# Patient Record
Sex: Male | Born: 1985 | Hispanic: No | Marital: Single | Smoking: Current some day smoker
Health system: Southern US, Community
[De-identification: ages and names within clinical notes are randomized; demographics above are authoritative.]

---

## 2007-08-14 ENCOUNTER — Emergency Department: Payer: Self-pay | Admitting: Emergency Medicine

## 2008-11-09 IMAGING — CR NASAL BONES - 3+ VIEW
1 series · 3 of 3 positions shown · non-contrast
Comparison: none

REASON FOR EXAM: injury
COMMENTS:

PROCEDURE:     DXR - DXR NASAL BONES  - August 15, 2007 [DATE]
RESULT:     Subtle distal nasal fracture cannot be entirely excluded. No
evidence of displaced nasal fracture. The paranasal sinuses are clear.

[Series 1: view not recorded · 0.17mm/px · 3 of 3 slices shown]
[im 1/3]
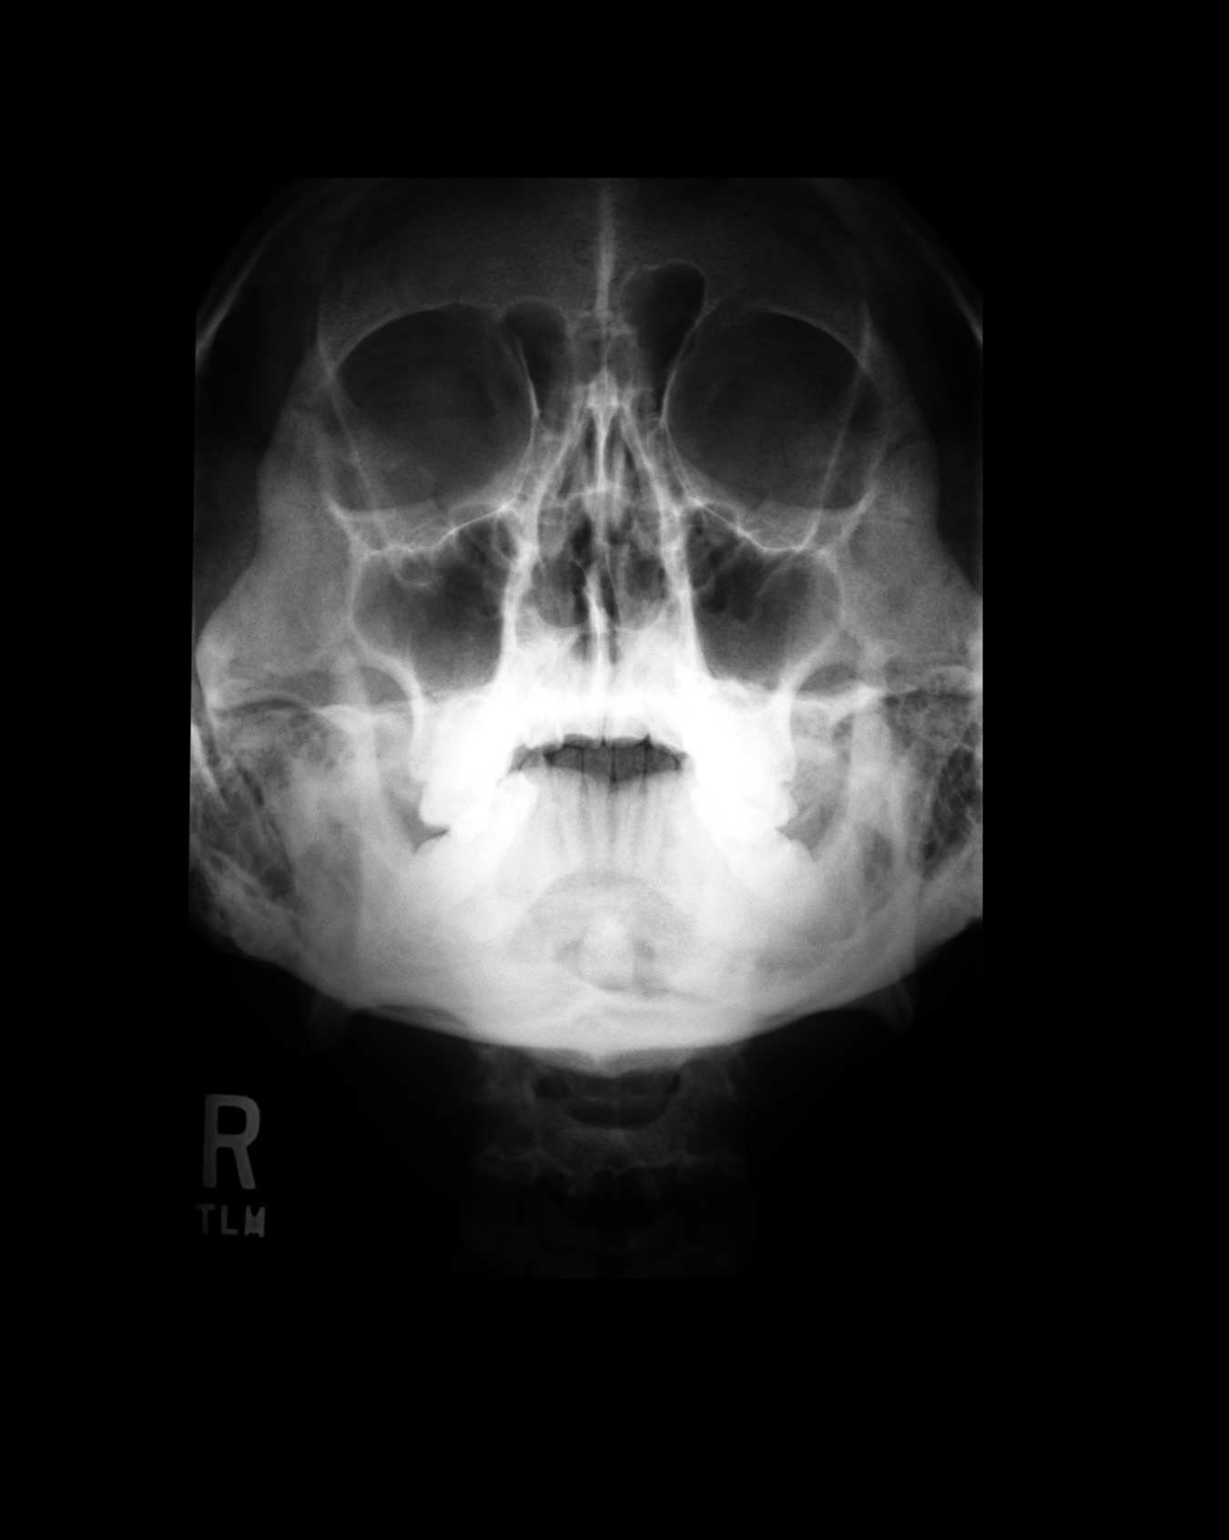
[im 2/3]
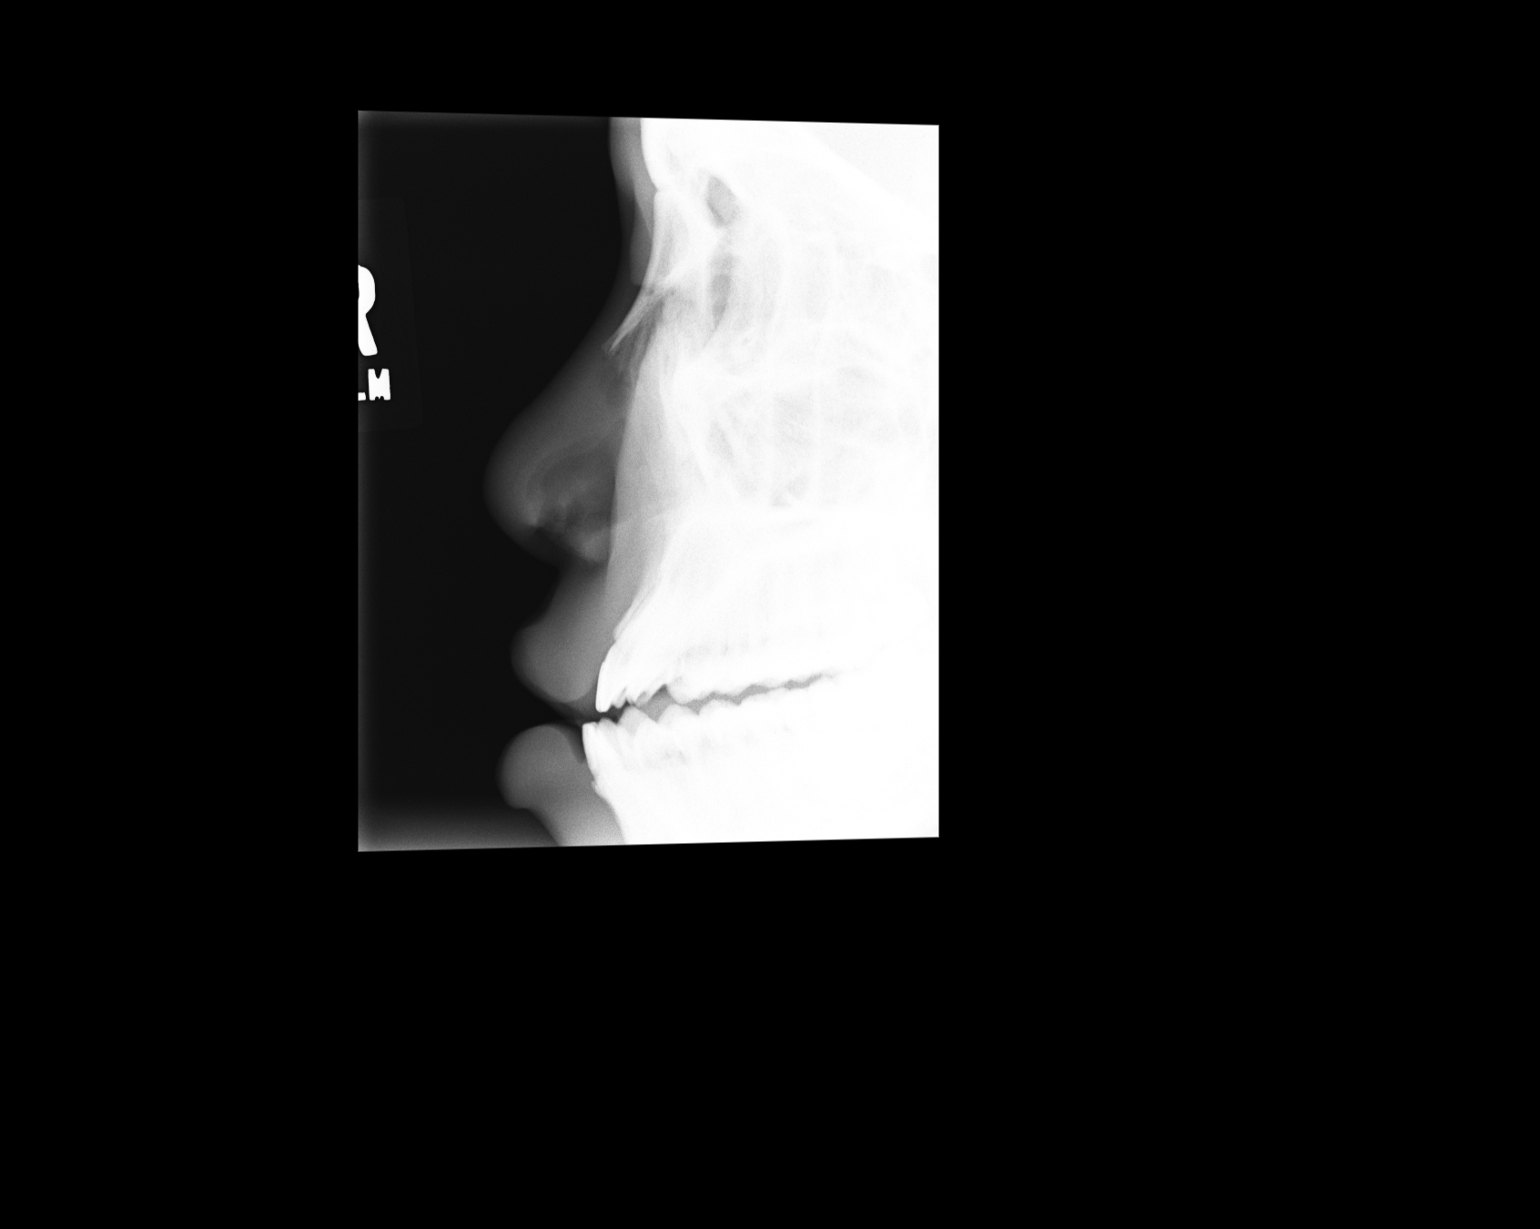
[im 3/3]
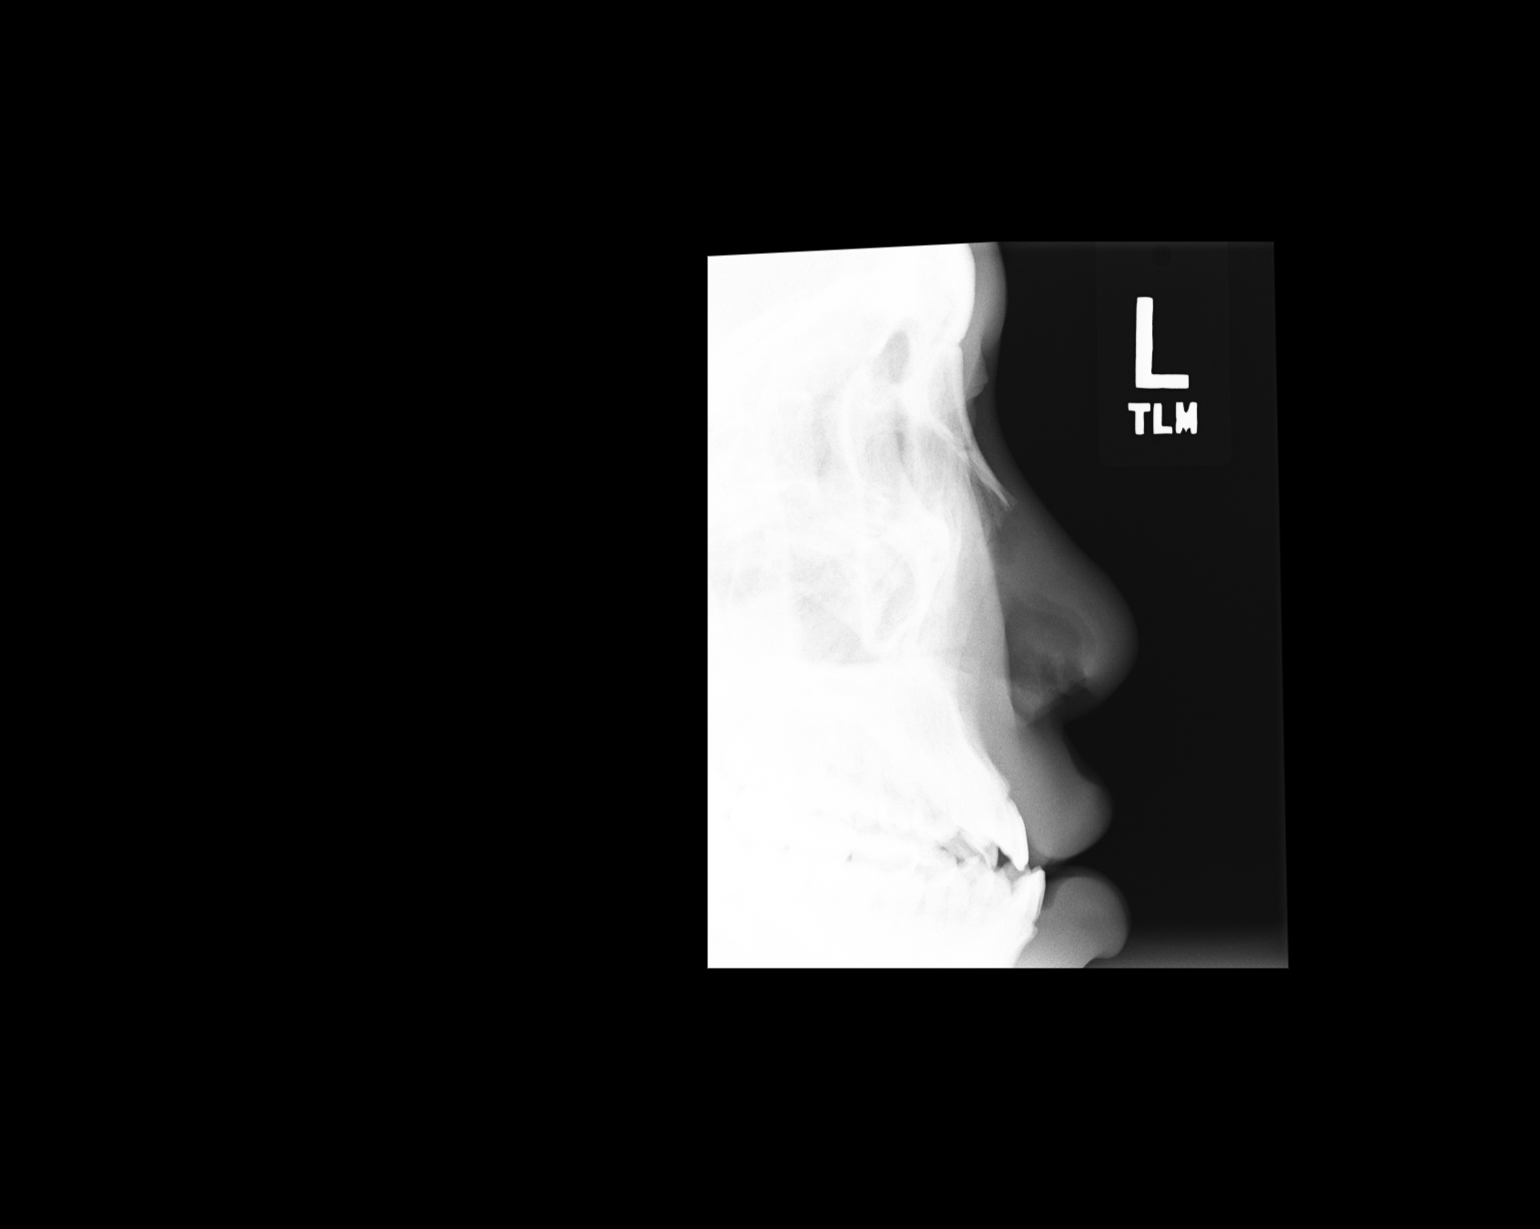

[3 of 3 positions shown; findings below may reference images not displayed]

IMPRESSION: No evidence of displaced nasal fracture. Subtle distal fracture cannot be
excluded.

## 2012-08-06 ENCOUNTER — Emergency Department: Payer: Self-pay | Admitting: Emergency Medicine

## 2012-08-06 LAB — URINALYSIS, COMPLETE
Bacteria: NONE SEEN
Bilirubin,UR: NEGATIVE
Blood: NEGATIVE
Glucose,UR: NEGATIVE mg/dL (ref 0–75)
Leukocyte Esterase: NEGATIVE
Protein: NEGATIVE
RBC,UR: <1 /HPF (ref 0–5)
Specific Gravity: 1.017 (ref 1.003–1.030)
Squamous Epithelial: <1

## 2012-08-06 LAB — TSH: Thyroid Stimulating Horm: 0.77 u[IU]/mL

## 2012-08-06 LAB — ETHANOL: Ethanol %: <0.003 % (ref 0.000–0.080)

## 2012-08-06 LAB — COMPREHENSIVE METABOLIC PANEL
Alkaline Phosphatase: 72 U/L (ref 50–136)
Anion Gap: 7 (ref 7–16)
Bilirubin,Total: 0.6 mg/dL (ref 0.2–1.0)
Calcium, Total: 8.9 mg/dL (ref 8.5–10.1)
Chloride: 106 mmol/L (ref 98–107)
Co2: 26 mmol/L (ref 21–32)
Creatinine: 1.01 mg/dL (ref 0.60–1.30)
EGFR (African American): >60
EGFR (Non-African Amer.): >60
Glucose: 93 mg/dL (ref 65–99)
Potassium: 4 mmol/L (ref 3.5–5.1)
SGOT(AST): 21 U/L (ref 15–37)
Sodium: 139 mmol/L (ref 136–145)
Total Protein: 8.4 g/dL — ABNORMAL HIGH (ref 6.4–8.2)

## 2012-08-06 LAB — CBC
HGB: 12.7 g/dL — ABNORMAL LOW (ref 13.0–18.0)
MCH: 25.1 pg — ABNORMAL LOW (ref 26.0–34.0)
MCHC: 32.2 g/dL (ref 32.0–36.0)
MCV: 78 fL — ABNORMAL LOW (ref 80–100)
Platelet: 309 10*3/uL (ref 150–440)
RBC: 5.05 10*6/uL (ref 4.40–5.90)
WBC: 8.2 10*3/uL (ref 3.8–10.6)

## 2012-08-06 LAB — DRUG SCREEN, URINE
Amphetamines, Ur Screen: NEGATIVE (ref ?–1000)
Benzodiazepine, Ur Scrn: NEGATIVE (ref ?–200)
Opiate, Ur Screen: POSITIVE (ref ?–300)
Phencyclidine (PCP) Ur S: NEGATIVE (ref ?–25)

## 2016-12-14 ENCOUNTER — Encounter: Payer: Self-pay | Admitting: Emergency Medicine

## 2016-12-14 ENCOUNTER — Emergency Department
Admission: EM | Admit: 2016-12-14 | Discharge: 2016-12-14 | Disposition: A | Discharge status: Home / Self Care | Payer: Self-pay | Attending: Emergency Medicine | Admitting: Emergency Medicine

## 2016-12-14 DIAGNOSIS — F17210 Nicotine dependence, cigarettes, uncomplicated: Secondary | ICD-10-CM | POA: Insufficient documentation

## 2016-12-14 DIAGNOSIS — R55 Syncope and collapse: Secondary | ICD-10-CM | POA: Insufficient documentation

## 2016-12-14 LAB — BASIC METABOLIC PANEL
Anion gap: 6 (ref 5–15)
BUN: 13 mg/dL (ref 6–20)
CALCIUM: 9.1 mg/dL (ref 8.9–10.3)
CHLORIDE: 103 mmol/L (ref 101–111)
CO2: 29 mmol/L (ref 22–32)
CREATININE: 0.83 mg/dL (ref 0.61–1.24)
GFR calc Af Amer: >60 mL/min (ref >60)
GFR calc non Af Amer: >60 mL/min (ref >60)
GLUCOSE: 106 mg/dL — AB (ref 65–99)
Potassium: 4.2 mmol/L (ref 3.5–5.1)
Sodium: 138 mmol/L (ref 135–145)

## 2016-12-14 LAB — URINALYSIS, COMPLETE (UACMP) WITH MICROSCOPIC
Bacteria, UA: NONE SEEN
Bilirubin Urine: NEGATIVE
Glucose, UA: NEGATIVE mg/dL
Hgb urine dipstick: NEGATIVE
Ketones, ur: NEGATIVE mg/dL
Leukocytes, UA: NEGATIVE
Nitrite: NEGATIVE
PH: 8 (ref 5.0–8.0)
Protein, ur: NEGATIVE mg/dL
SPECIFIC GRAVITY, URINE: 1.018 (ref 1.005–1.030)
SQUAMOUS EPITHELIAL / LPF: NONE SEEN

## 2016-12-14 LAB — CBC
HCT: 38.3 % — ABNORMAL LOW (ref 40.0–52.0)
Hemoglobin: 12.6 g/dL — ABNORMAL LOW (ref 13.0–18.0)
MCH: 25.8 pg — AB (ref 26.0–34.0)
MCHC: 32.9 g/dL (ref 32.0–36.0)
MCV: 78.3 fL — AB (ref 80.0–100.0)
PLATELETS: 277 10*3/uL (ref 150–440)
RBC: 4.9 MIL/uL (ref 4.40–5.90)
RDW: 14.3 % (ref 11.5–14.5)
WBC: 7 10*3/uL (ref 3.8–10.6)

## 2016-12-14 LAB — TROPONIN I

## 2016-12-14 LAB — GLUCOSE, CAPILLARY: Glucose-Capillary: 110 mg/dL — ABNORMAL HIGH (ref 65–99)

## 2016-12-14 NOTE — ED Triage Notes (Signed)
Pt to ed via POV, pt states that he had a syncopal episode 2 days ago after bending down for an extended period of time. Pt states that since then he has been feeling more tired than normal. Pt does not appear to be in any distress at this time.

## 2016-12-14 NOTE — ED Provider Notes (Signed)
[facility] Emergency Department Provider Note   ____________________________________________    I have reviewed the triage vital signs and the nursing notes.   HISTORY  Chief Complaint Loss of Consciousness     HPI Lucas Nichols is a 31 y.o. male who presents for evaluation because he reports a syncopal episode 2 days ago. Patient reports 2 days ago he was thoroughly cleaning the floor by bending over and then when he straightened up he felt lightheaded and had to sit down and thinks he may have blacked out for a few seconds. He denies any chest pain or palpitations. No recent travel or calf pain. No nausea or vomiting. He does use marijuana and occasionally uses Adderall but had not been that day. History of of opioid addiction. Currently feels quite well and has no complaints.   History reviewed. No pertinent past medical history.  There are no active problems to display for this patient.   History reviewed. No pertinent surgical history.  Prior to Admission medications   Not on File     Allergies Patient has no known allergies.  No family history on file.  Social History Social History  Substance Use Topics  . Smoking status: Current Some Day Smoker    Types: E-cigarettes  . Smokeless tobacco: Never Used  . Alcohol use No    Review of Systems  Constitutional: No fever/chills Eyes: No visual changes.  ENT: No sore throat. Cardiovascular: Denies chest pain. Respiratory: Denies shortness of breath. Gastrointestinal: No abdominal pain.  No nausea, no vomiting.   Genitourinary: Negative for dysuria. Musculoskeletal: Negative for back pain. Skin: Negative for rash. Neurological: Negative for headaches or weakness   ____________________________________________   PHYSICAL EXAM:  VITAL SIGNS: ED Triage Vitals [12/14/16 1525]  Enc Vitals Group     BP 130/78     Pulse Rate 86     Resp 18     Temp 98.4 F (36.9 C)   Temp Source Oral     SpO2 100 %     Weight 145 lb (65.8 kg)     Height 5\' 7"  (1.702 m)     Head Circumference      Peak Flow      Pain Score      Pain Loc      Pain Edu?      Excl. in GC?     Constitutional: Alert and oriented. No acute distress. Pleasant and interactive. Patient is anxious Eyes: Conjunctivae are normal.  Head: Atraumatic. Nose: No congestion/rhinnorhea. Mouth/Throat: Mucous membranes are moist.   Neck:  Painless ROM Cardiovascular: Normal rate, regular rhythm. Grossly normal heart sounds.  Good peripheral circulation. Respiratory: Normal respiratory effort.  No retractions. Lungs CTAB. Gastrointestinal: Soft and nontender. No distention.  No CVA tenderness. Genitourinary: deferred Musculoskeletal: No lower extremity tenderness nor edema.  Warm and well perfused Neurologic:  Normal speech and language. No gross focal neurologic deficits are appreciated.  Skin:  Skin is warm, dry and intact. No rash noted. Psychiatric: Mood and affect are normal. Speech and behavior are normal.  ____________________________________________   LABS (all labs ordered are listed, but only abnormal results are displayed)  Labs Reviewed  BASIC METABOLIC PANEL - Abnormal; Notable for the following:       Result Value   Glucose, Bld 106 (*)    All other components within normal limits  CBC - Abnormal; Notable for the following:    Hemoglobin 12.6 (*)    HCT 38.3 (*)  MCV 78.3 (*)    MCH 25.8 (*)    All other components within normal limits  URINALYSIS, COMPLETE (UACMP) WITH MICROSCOPIC - Abnormal; Notable for the following:    Color, Urine YELLOW (*)    APPearance HAZY (*)    All other components within normal limits  GLUCOSE, CAPILLARY - Abnormal; Notable for the following:    Glucose-Capillary 110 (*)    All other components within normal limits  TROPONIN I  CBG MONITORING, ED   ____________________________________________  EKG  ED ECG REPORT I, Jene EveryKINNER, Evee Liska,  the attending physician, personally viewed and interpreted this ECG.  Date: 12/14/2016  Rhythm: normal sinus rhythm QRS Axis: normal Intervals: normal ST/T Wave abnormalities: normal Conduction Disturbances: none Narrative Interpretation: unremarkable  ____________________________________________  RADIOLOGY  None ____________________________________________   PROCEDURES  Procedure(s) performed: No    Critical Care performed:No ____________________________________________   INITIAL IMPRESSION / ASSESSMENT AND PLAN / ED COURSE  Pertinent labs & imaging results that were available during my care of the patient were reviewed by me and considered in my medical decision making (see chart for details).  Patient well-appearing and in no acute distress. He is asymptomatic today. Vital signs are normal. Lab work is unremarkable. EKG is reassuring. Do not feel further workup is warranted at this time, suspect near syncope was postural in nature. Patient is to return if any change or recurrence of his symptoms.    ____________________________________________   FINAL CLINICAL IMPRESSION(S) / ED DIAGNOSES  Final diagnoses:  Syncope, unspecified syncope type      NEW MEDICATIONS STARTED DURING THIS VISIT:  There are no discharge medications for this patient.    Note:  This document was prepared using Dragon voice recognition software and may include unintentional dictation errors.    Jene EveryKinner, Thersia Petraglia, MD 12/14/16 2036

## 2019-11-10 ENCOUNTER — Emergency Department
Admission: EM | Admit: 2019-11-10 | Discharge: 2019-11-10 | Disposition: A | Discharge status: Home / Self Care | Payer: Self-pay | Attending: Emergency Medicine | Admitting: Emergency Medicine

## 2019-11-10 ENCOUNTER — Emergency Department: Payer: Self-pay

## 2019-11-10 ENCOUNTER — Other Ambulatory Visit: Payer: Self-pay

## 2019-11-10 DIAGNOSIS — F1290 Cannabis use, unspecified, uncomplicated: Secondary | ICD-10-CM | POA: Insufficient documentation

## 2019-11-10 DIAGNOSIS — T50901A Poisoning by unspecified drugs, medicaments and biological substances, accidental (unintentional), initial encounter: Secondary | ICD-10-CM

## 2019-11-10 DIAGNOSIS — T402X1A Poisoning by other opioids, accidental (unintentional), initial encounter: Secondary | ICD-10-CM | POA: Insufficient documentation

## 2019-11-10 DIAGNOSIS — F17290 Nicotine dependence, other tobacco product, uncomplicated: Secondary | ICD-10-CM | POA: Insufficient documentation

## 2019-11-10 LAB — CBC WITH DIFFERENTIAL/PLATELET
Abs Immature Granulocytes: 0.01 10*3/uL (ref 0.00–0.07)
Basophils Absolute: 0.1 10*3/uL (ref 0.0–0.1)
Basophils Relative: 1 %
Eosinophils Absolute: 0.6 10*3/uL — ABNORMAL HIGH (ref 0.0–0.5)
Eosinophils Relative: 7 %
HCT: 39 % (ref 39.0–52.0)
Hemoglobin: 12.6 g/dL — ABNORMAL LOW (ref 13.0–17.0)
Immature Granulocytes: 0 %
Lymphocytes Relative: 37 %
Lymphs Abs: 3 10*3/uL (ref 0.7–4.0)
MCH: 24.7 pg — ABNORMAL LOW (ref 26.0–34.0)
MCHC: 32.3 g/dL (ref 30.0–36.0)
MCV: 76.3 fL — ABNORMAL LOW (ref 80.0–100.0)
Monocytes Absolute: 0.7 10*3/uL (ref 0.1–1.0)
Monocytes Relative: 9 %
Neutro Abs: 3.8 10*3/uL (ref 1.7–7.7)
Neutrophils Relative %: 46 %
Platelets: 321 10*3/uL (ref 150–400)
RBC: 5.11 MIL/uL (ref 4.22–5.81)
RDW: 13.5 % (ref 11.5–15.5)
WBC: 8.1 10*3/uL (ref 4.0–10.5)
nRBC: 0 % (ref 0.0–0.2)

## 2019-11-10 LAB — COMPREHENSIVE METABOLIC PANEL
ALT: 19 U/L (ref 0–44)
AST: 19 U/L (ref 15–41)
Albumin: 4.6 g/dL (ref 3.5–5.0)
Alkaline Phosphatase: 51 U/L (ref 38–126)
Anion gap: 9 (ref 5–15)
BUN: 12 mg/dL (ref 6–20)
CO2: 25 mmol/L (ref 22–32)
Calcium: 9 mg/dL (ref 8.9–10.3)
Chloride: 104 mmol/L (ref 98–111)
Creatinine, Ser: 1.17 mg/dL (ref 0.61–1.24)
GFR calc Af Amer: >60 mL/min (ref >60)
GFR calc non Af Amer: >60 mL/min (ref >60)
Glucose, Bld: 138 mg/dL — ABNORMAL HIGH (ref 70–99)
Potassium: 3.6 mmol/L (ref 3.5–5.1)
Sodium: 138 mmol/L (ref 135–145)
Total Bilirubin: 0.7 mg/dL (ref 0.3–1.2)
Total Protein: 8.3 g/dL — ABNORMAL HIGH (ref 6.5–8.1)

## 2019-11-10 LAB — URINE DRUG SCREEN, QUALITATIVE (ARMC ONLY)
Amphetamines, Ur Screen: POSITIVE — AB
Barbiturates, Ur Screen: NOT DETECTED
Benzodiazepine, Ur Scrn: POSITIVE — AB
Cannabinoid 50 Ng, Ur ~~LOC~~: POSITIVE — AB
Cocaine Metabolite,Ur ~~LOC~~: NOT DETECTED
MDMA (Ecstasy)Ur Screen: NOT DETECTED
Methadone Scn, Ur: NOT DETECTED
Opiate, Ur Screen: NOT DETECTED
Phencyclidine (PCP) Ur S: NOT DETECTED
Tricyclic, Ur Screen: NOT DETECTED

## 2019-11-10 LAB — ETHANOL: Alcohol, Ethyl (B): <10 mg/dL (ref <10)

## 2019-11-10 LAB — ACETAMINOPHEN LEVEL: Acetaminophen (Tylenol), Serum: <10 ug/mL — ABNORMAL LOW (ref 10–30)

## 2019-11-10 LAB — SALICYLATE LEVEL: Salicylate Lvl: <7 mg/dL — ABNORMAL LOW (ref 7.0–30.0)

## 2019-11-10 MED ORDER — SODIUM CHLORIDE 0.9 % IV BOLUS
1000.0000 mL | Freq: Once | INTRAVENOUS | Status: AC
  Administered 2019-11-10: 1000 mL via INTRAVENOUS

## 2019-11-10 NOTE — Discharge Instructions (Addendum)
Return to the ER for worsening symptoms, persistent vomiting, difficulty breathing or other concerns. °

## 2019-11-10 NOTE — ED Provider Notes (Signed)
[facility] Emergency Department Provider Note   ____________________________________________   First MD Initiated Contact with Patient 11/10/19 [phone]     (approximate)  I have reviewed the triage vital signs and the nursing notes.   HISTORY  Chief Complaint Overdose   HPI Lucas Nichols is a 34 y.o. male brought to the ED via EMS from home for accidental overdose.  Patient has a history of anxiety and depression who self medicates through Adderall and oxycodone.  Reportedly took 30 mg oxycodone prior to being found unresponsive.  Good response with Narcan given on scene.  Denies active SI/HI/AH/VH.  Voices no medical complaints currently.  Specifically, denies chest pain, shortness of breath, abdominal pain, nausea or vomiting.      Past medical history Anxiety Depression Migraines  There are no problems to display for this patient.   History reviewed. No pertinent surgical history.  Prior to Admission medications   Not on File    Allergies Patient has no known allergies.  History reviewed. No pertinent family history.  Social History Social History   Tobacco Use  . Smoking status: Current Some Day Smoker    Types: E-cigarettes  . Smokeless tobacco: Never Used  Substance Use Topics  . Alcohol use: No  . Drug use: Yes    Review of Systems  Constitutional: No fever/chills Eyes: No visual changes. ENT: No sore throat. Cardiovascular: Denies chest pain. Respiratory: Denies shortness of breath. Gastrointestinal: No abdominal pain.  No nausea, no vomiting.  No diarrhea.  No constipation. Genitourinary: Negative for dysuria. Musculoskeletal: Negative for back pain. Skin: Negative for rash. Neurological: Negative for headaches, focal weakness or numbness. Psychiatric:  Positive for polysubstance use.  ____________________________________________   PHYSICAL EXAM:  VITAL SIGNS: ED Triage Vitals  Enc Vitals Group     BP    Pulse      Resp      Temp      Temp src      SpO2      Weight      Height      Head Circumference      Peak Flow      Pain Score      Pain Loc      Pain Edu?      Excl. in GC?     Constitutional: Alert and oriented. Well appearing and in no acute distress. Eyes: Conjunctivae are normal. PERRL. EOMI. Head: Atraumatic. Nose: No congestion/rhinnorhea. Mouth/Throat: Mucous membranes are moist.   Neck: No stridor.   Cardiovascular: Normal rate, regular rhythm. Grossly normal heart sounds.  Good peripheral circulation. Respiratory: Normal respiratory effort.  No retractions. Lungs CTAB. Gastrointestinal: Soft and nontender to light or deep palpation. No distention. No abdominal bruits. No CVA tenderness. Musculoskeletal: No lower extremity tenderness nor edema.  No joint effusions. Neurologic:  Normal speech and language. No gross focal neurologic deficits are appreciated.  Skin:  Skin is warm, dry and intact. No rash noted. Psychiatric: Mood and affect are normal. Speech and behavior are normal.  ____________________________________________   LABS (all labs ordered are listed, but only abnormal results are displayed)  Labs Reviewed  CBC WITH DIFFERENTIAL/PLATELET - Abnormal; Notable for the following components:      Result Value   Hemoglobin 12.6 (*)    MCV 76.3 (*)    MCH 24.7 (*)    Eosinophils Absolute 0.6 (*)    All other components within normal limits  COMPREHENSIVE METABOLIC PANEL - Abnormal; Notable for the following  components:   Glucose, Bld 138 (*)    Total Protein 8.3 (*)    All other components within normal limits  ACETAMINOPHEN LEVEL - Abnormal; Notable for the following components:   Acetaminophen (Tylenol), Serum <10 (*)    All other components within normal limits  SALICYLATE LEVEL - Abnormal; Notable for the following components:   Salicylate Lvl <7.0 (*)    All other components within normal limits  URINE DRUG SCREEN, QUALITATIVE ([facility] ONLY) -  Abnormal; Notable for the following components:   Amphetamines, Ur Screen POSITIVE (*)    Cannabinoid 50 Ng, Ur [place] POSITIVE (*)    Benzodiazepine, Ur Scrn POSITIVE (*)    All other components within normal limits  ETHANOL   ____________________________________________  EKG  ED ECG REPORT I, Chyla Schlender J, the attending physician, personally viewed and interpreted this ECG.   Date: 11/10/2019  EKG Time: 0400  Rate: 90  Rhythm: normal EKG, normal sinus rhythm  Axis: Normal  Intervals:none  ST&T Change: Nonspecific  ____________________________________________  RADIOLOGY  ED MD interpretation: No acute cardiopulmonary process  Official radiology report(s): DG Chest Port 1 View  Result Date: 11/10/2019 CLINICAL DATA:  Reported drug overdose. EXAM: PORTABLE CHEST 1 VIEW COMPARISON:  None. FINDINGS: The heart size and mediastinal contours are within normal limits. Both lungs are clear. The visualized skeletal structures are unremarkable. IMPRESSION: No active disease. Electronically Signed   By: Aram Candela M.D.   On: 11/10/2019 04:07    ____________________________________________   PROCEDURES  Procedure(s) performed (including Critical Care):  .1-3 Lead EKG Interpretation Performed by: Irean Hong, MD Authorized by: Irean Hong, MD       ____________________________________________   INITIAL IMPRESSION / ASSESSMENT AND PLAN / ED COURSE  As part of my medical decision making, I reviewed the following data within the electronic MEDICAL RECORD NUMBER Nursing notes reviewed and incorporated, Labs reviewed, EKG interpreted, Old chart reviewed, Radiograph reviewed and Notes from prior ED visits     Lucas Nichols was evaluated in Emergency Department on 11/10/2019 for the symptoms described in the history of present illness. He was evaluated in the context of the global COVID-19 pandemic, which necessitated consideration that the patient might be at risk for infection  with the SARS-CoV-2 virus that causes COVID-19. Institutional protocols and algorithms that pertain to the evaluation of patients at risk for COVID-19 are in a state of rapid change based on information released by regulatory bodies including the CDC and federal and state organizations. These policies and algorithms were followed during the patient's care in the ED.    34 year old male who presents status post accidental overdose of Adderall and oxycodone.  Good response to Narcan given on scene.  Will obtain toxicological lab work and urine, chest x-ray.  Monitor in the ED for several hours.   Clinical Course as of Nov 09 704  Wed Nov 10, 2019  2542 Patient is awake and ambulatory with steady gait.  Strict return precautions given.  Patient verbalizes understanding agrees with plan of care.   [JS]    Clinical Course User Index [JS] Irean Hong, MD     ____________________________________________   FINAL CLINICAL IMPRESSION(S) / ED DIAGNOSES  Final diagnoses:  Accidental drug overdose, initial encounter  Marijuana use     ED Discharge Orders    None       Note:  This document was prepared using Dragon voice recognition software and may include unintentional dictation errors.   Irean Hong,  MD 11/10/19 8032

## 2019-11-10 NOTE — ED Notes (Signed)
Pt requests door to be shut to provide privacy with urine sample

## 2019-11-10 NOTE — ED Triage Notes (Signed)
Pt comes from home where he experienced a reported drug overdose. PT states he took one 30mg  oxycodone, one 2mg  adderal, and one 1 or 2 mg of a benzo. Pt received .5 of Narcan and condition improved. Prior to Ems arrival and Narcan administration pt was unresponsive and had a RR of 3-4 x a minute. Pt did fall at home but reports no pain.

## 2019-11-10 NOTE — ED Notes (Signed)
Pt asked for urine sample by this nurse, pt requests drink. Warm blanket and ginger ale provided for PT

## 2019-11-10 NOTE — ED Notes (Signed)
Pt again asked for urine sample, states he will attempt but that the feeling of going to the restroom has not hit him yet

## 2019-11-10 NOTE — ED Notes (Signed)
Pt provided urinal for urine sample collection

## 2021-02-05 IMAGING — DX DG CHEST 1V PORT
1 series · 1 of 1 positions shown · non-contrast
Comparison: None.

CLINICAL DATA: Reported drug overdose.

EXAM:
PORTABLE CHEST 1 VIEW

[chest ap]
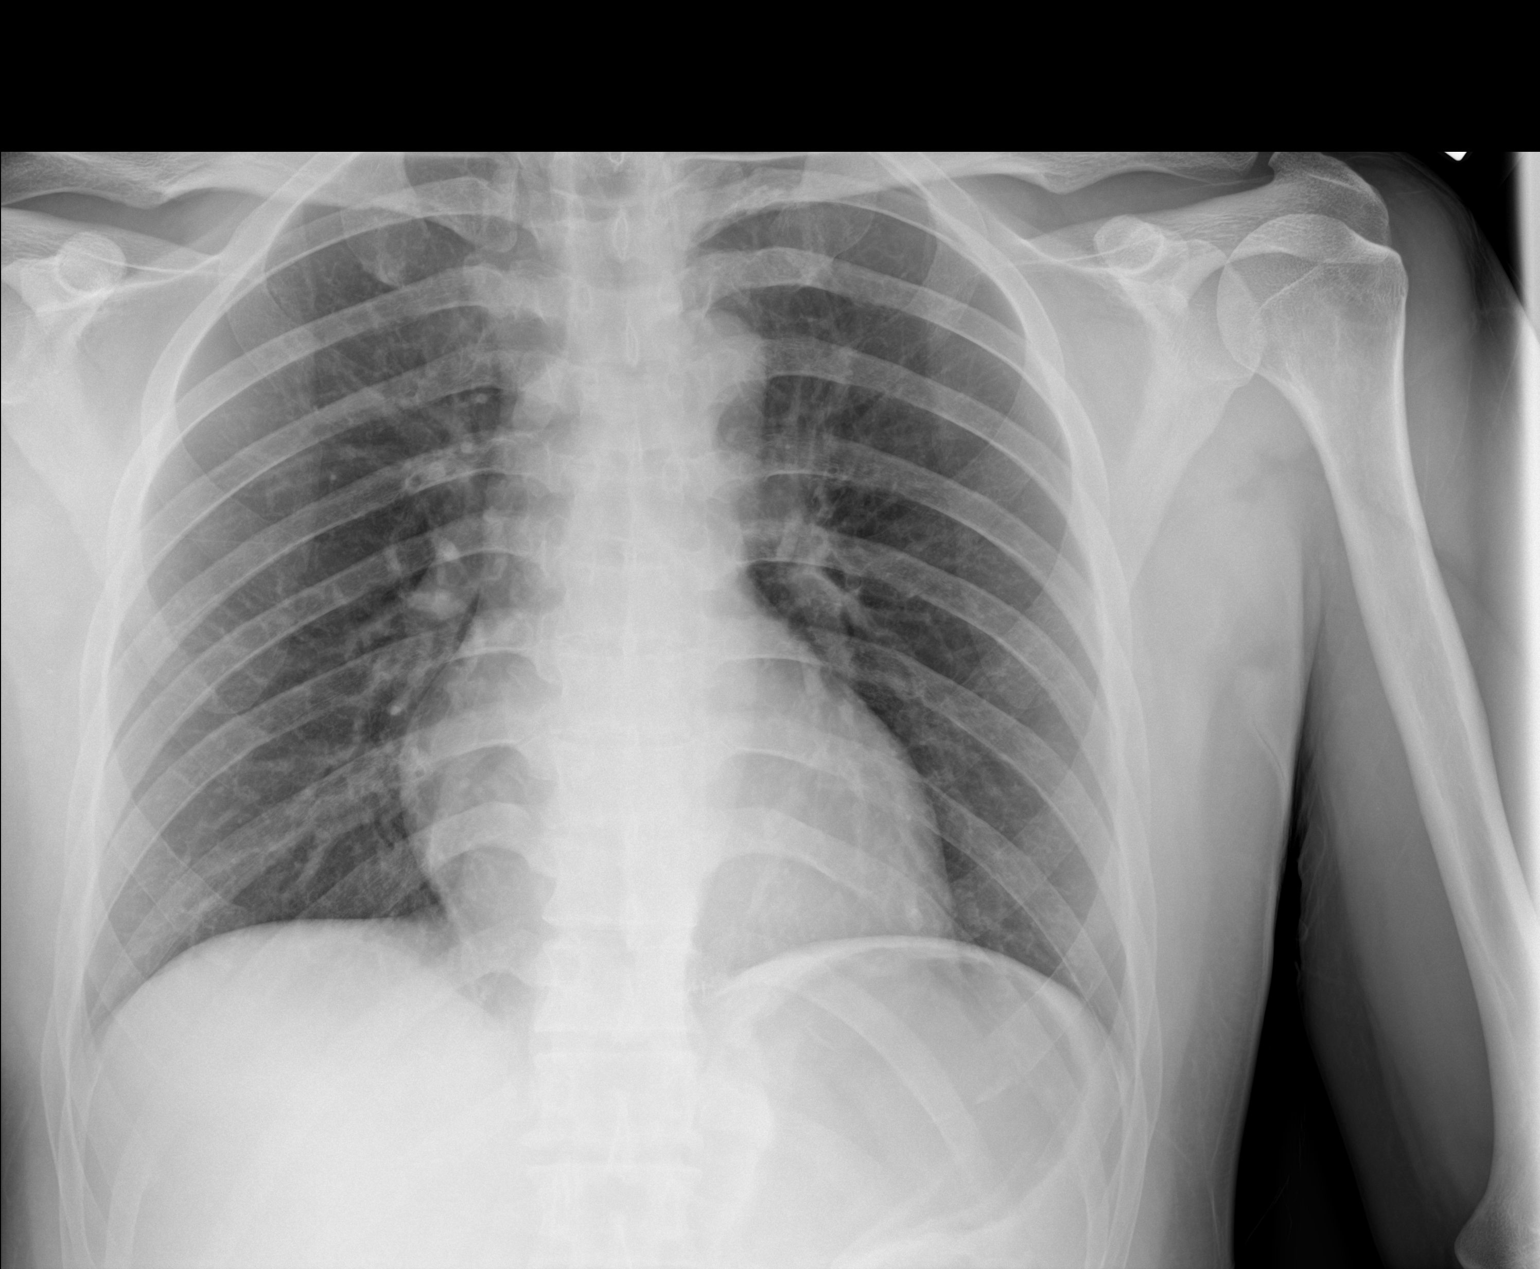

[1 of 1 positions shown; findings below may reference images not displayed]

FINDINGS: The heart size and mediastinal contours are within normal limits.
Both lungs are clear. The visualized skeletal structures are
unremarkable.
IMPRESSION: No active disease.

## 2024-02-22 ENCOUNTER — Other Ambulatory Visit: Payer: Self-pay

## 2024-02-22 ENCOUNTER — Emergency Department
Admission: EM | Admit: 2024-02-22 | Discharge: 2024-02-22 | Disposition: A | Discharge status: Home / Self Care | Payer: Self-pay | Attending: Emergency Medicine | Admitting: Emergency Medicine

## 2024-02-22 DIAGNOSIS — W25XXXA Contact with sharp glass, initial encounter: Secondary | ICD-10-CM | POA: Insufficient documentation

## 2024-02-22 DIAGNOSIS — S51812A Laceration without foreign body of left forearm, initial encounter: Secondary | ICD-10-CM | POA: Insufficient documentation

## 2024-02-22 MED ORDER — TETANUS-DIPHTH-ACELL PERTUSSIS 5-2.5-18.5 LF-MCG/0.5 IM SUSY
0.5000 mL | PREFILLED_SYRINGE | Freq: Once | INTRAMUSCULAR | Status: DC
  Filled 2024-02-22: qty 0.5
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
## 2024-02-22 NOTE — Discharge Instructions (Signed)
 You have Dermabond on the wound.  This should fall off in the next 7 to 14 days.  Please return if the wound opens up or if you notice any pus drainage.  Otherwise simple soap and water to clean the site.
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
